# Patient Record
Sex: Female | Born: 2005
Health system: Southern US, Community
[De-identification: ages and names within clinical notes are randomized; demographics above are authoritative.]

## PROBLEM LIST (undated history)

## (undated) DIAGNOSIS — J45909 Unspecified asthma, uncomplicated: Secondary | ICD-10-CM

## (undated) DIAGNOSIS — F509 Eating disorder, unspecified: Secondary | ICD-10-CM

## (undated) HISTORY — PX: NO PAST SURGERIES: SHX2092

---
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
## 2024-05-11 ENCOUNTER — Ambulatory Visit: Admitting: Family
